# Patient Record
Sex: Male | Born: 1980 | Race: Black or African American | Hispanic: No | Marital: Single | Smoking: Current every day smoker
Health system: Southern US, Community
[De-identification: ages and names within clinical notes are randomized; demographics above are authoritative.]

---

## 1999-03-27 ENCOUNTER — Emergency Department (HOSPITAL_COMMUNITY): Admission: EM | Admit: 1999-03-27 | Discharge: 1999-03-27 | Payer: Self-pay | Admitting: Emergency Medicine

## 1999-11-01 ENCOUNTER — Emergency Department (HOSPITAL_COMMUNITY): Admission: EM | Admit: 1999-11-01 | Discharge: 1999-11-01 | Payer: Self-pay | Admitting: Emergency Medicine

## 2000-03-17 ENCOUNTER — Emergency Department (HOSPITAL_COMMUNITY): Admission: EM | Admit: 2000-03-17 | Discharge: 2000-03-17 | Payer: Self-pay | Admitting: Emergency Medicine

## 2001-07-20 ENCOUNTER — Emergency Department (HOSPITAL_COMMUNITY): Admission: EM | Admit: 2001-07-20 | Discharge: 2001-07-20 | Payer: Self-pay | Admitting: Emergency Medicine

## 2001-11-18 ENCOUNTER — Emergency Department (HOSPITAL_COMMUNITY): Admission: EM | Admit: 2001-11-18 | Discharge: 2001-11-18 | Payer: Self-pay | Admitting: *Deleted

## 2001-11-18 ENCOUNTER — Encounter: Payer: Self-pay | Admitting: Emergency Medicine

## 2003-06-27 ENCOUNTER — Encounter: Payer: Self-pay | Admitting: Emergency Medicine

## 2003-06-27 ENCOUNTER — Emergency Department (HOSPITAL_COMMUNITY): Admission: EM | Admit: 2003-06-27 | Discharge: 2003-06-27 | Payer: Self-pay | Admitting: Emergency Medicine

## 2003-07-11 ENCOUNTER — Emergency Department (HOSPITAL_COMMUNITY): Admission: EM | Admit: 2003-07-11 | Discharge: 2003-07-11 | Payer: Self-pay | Admitting: Emergency Medicine

## 2004-07-24 ENCOUNTER — Emergency Department (HOSPITAL_COMMUNITY): Admission: EM | Admit: 2004-07-24 | Discharge: 2004-07-24 | Payer: Self-pay | Admitting: Emergency Medicine

## 2004-08-13 ENCOUNTER — Emergency Department (HOSPITAL_COMMUNITY): Admission: EM | Admit: 2004-08-13 | Discharge: 2004-08-13 | Payer: Self-pay

## 2004-09-20 ENCOUNTER — Emergency Department (HOSPITAL_COMMUNITY): Admission: EM | Admit: 2004-09-20 | Discharge: 2004-09-20 | Payer: Self-pay | Admitting: Emergency Medicine

## 2005-10-06 ENCOUNTER — Emergency Department (HOSPITAL_COMMUNITY): Admission: EM | Admit: 2005-10-06 | Discharge: 2005-10-06 | Payer: Self-pay | Admitting: Emergency Medicine

## 2005-10-17 ENCOUNTER — Emergency Department (HOSPITAL_COMMUNITY): Admission: EM | Admit: 2005-10-17 | Discharge: 2005-10-17 | Payer: Self-pay | Admitting: Emergency Medicine

## 2011-12-27 ENCOUNTER — Emergency Department (HOSPITAL_COMMUNITY)
Admission: EM | Admit: 2011-12-27 | Discharge: 2011-12-27 | Disposition: A | Discharge status: Home / Self Care | Payer: Self-pay | Attending: Emergency Medicine | Admitting: Emergency Medicine

## 2011-12-27 ENCOUNTER — Encounter: Payer: Self-pay | Admitting: Emergency Medicine

## 2011-12-27 ENCOUNTER — Emergency Department (HOSPITAL_COMMUNITY): Payer: Self-pay

## 2011-12-27 DIAGNOSIS — S0005XA Superficial foreign body of scalp, initial encounter: Secondary | ICD-10-CM | POA: Insufficient documentation

## 2011-12-27 DIAGNOSIS — S00551A Superficial foreign body of lip, initial encounter: Secondary | ICD-10-CM

## 2011-12-27 DIAGNOSIS — S025XXA Fracture of tooth (traumatic), initial encounter for closed fracture: Secondary | ICD-10-CM | POA: Insufficient documentation

## 2011-12-27 DIAGNOSIS — S0085XA Superficial foreign body of other part of head, initial encounter: Secondary | ICD-10-CM | POA: Insufficient documentation

## 2011-12-27 DIAGNOSIS — IMO0002 Reserved for concepts with insufficient information to code with codable children: Secondary | ICD-10-CM | POA: Insufficient documentation

## 2011-12-27 MED ORDER — CEPHALEXIN 500 MG PO CAPS
500.0000 mg | ORAL_CAPSULE | Freq: Once | ORAL | Status: AC
  Administered 2011-12-27: 500 mg via ORAL
  Filled 2011-12-27: qty 1

## 2011-12-27 MED ORDER — LIDOCAINE HCL (PF) 1 % IJ SOLN
5.0000 mL | Freq: Once | INTRAMUSCULAR | Status: AC
  Administered 2011-12-27: 5 mL
  Filled 2011-12-27: qty 5

## 2011-12-27 MED ORDER — CEPHALEXIN 500 MG PO CAPS: 500.0000 mg | ORAL_CAPSULE | Freq: Four times a day (QID) | ORAL | Status: AC

## 2011-12-27 MED ORDER — BUPIVACAINE HCL (PF) 0.5 % IJ SOLN
20.0000 mL | Freq: Once | INTRAMUSCULAR | Status: AC
  Administered 2011-12-27: 5 mL
  Filled 2011-12-27: qty 30

## 2011-12-27 MED ORDER — LIDOCAINE HCL 1 % IJ SOLN
INTRAMUSCULAR | Status: AC
  Filled 2011-12-27: qty 20

## 2011-12-27 NOTE — ED Provider Notes (Signed)
Medical screening examination/treatment/procedure(s) were performed by non-physician practitioner and as supervising physician I was immediately available for consultation/collaboration.  Mack Thurmon T Tadarius Maland, MD 12/27/11 2341 

## 2011-12-27 NOTE — ED Notes (Signed)
Pt was working with a hammer that slipped, hitting him in the mouth. Through and through laceration to lower lip. Also broke right central incisor.

## 2011-12-27 NOTE — ED Provider Notes (Signed)
History     CSN: [id number]  Arrival date & time 12/27/11  1620   First MD Initiated Contact with Patient 12/27/11 1706      Chief Complaint  Patient presents with  . Lip Laceration    (Consider location/radiation/quality/duration/timing/severity/associated sxs/prior treatment) HPI Comments: Patient was pain.  She brought in a piece fell and hit him in the mouth.  Now has a small laceration to his lower lip broken upper tooth.  Patient feels that there may be a piece of his tooth embedded in his lip  The history is provided by the patient.    History reviewed. No pertinent past medical history.  No past surgical history on file.  No family history on file.  History  Substance Use Topics  . Smoking status: Current Everyday Smoker  . Smokeless tobacco: Not on file  . Alcohol Use:       Review of Systems  Constitutional: Negative.   HENT: Positive for facial swelling.   Eyes: Negative.   Gastrointestinal: Negative for nausea.  Musculoskeletal: Negative.   Neurological: Negative for dizziness and tremors.  Hematological: Negative.   Psychiatric/Behavioral: Negative.     Allergies  Acetaminophen  Home Medications   Current Outpatient Rx  Name Route Sig Dispense Refill  . CEPHALEXIN 500 MG PO CAPS Oral Take 1 capsule (500 mg total) by mouth 4 (four) times daily. 40 capsule 0    BP 129/57  Pulse 70  Temp 98.6 F (37 C)  Resp 16  SpO2 99%  Physical Exam  Constitutional: He appears well-developed and well-nourished.  HENT:  Mouth/Throat: Uvula is midline. Abnormal dentition.    Neck: Normal range of motion.  Cardiovascular: Normal rate.   Pulmonary/Chest: Effort normal.  Musculoskeletal: Normal range of motion.  Skin: Skin is warm.  Psychiatric: He has a normal mood and affect.    ED Course  FOREIGN BODY REMOVAL Date/Time: 12/27/2011 7:22 PM Performed by: Arman Filter Authorized by: Arman Filter Consent: Verbal consent obtained. Risks and  benefits: risks, benefits and alternatives were discussed Consent given by: patient Time out: Immediately prior to procedure a "time out" was called to verify the correct patient, procedure, equipment, support staff and site/side marked as required. Body area: mucosa General location: head/neck Anesthesia: nerve block Patient sedated: no Patient restrained: no Localization method: probed Tendon involvement: none Depth: deep Complexity: simple 1 objects recovered. Objects recovered: tooth fragment Post-procedure assessment: foreign body removed Patient tolerance: Patient tolerated the procedure well with no immediate complications.   (including critical care time)  Labs Reviewed - No data to display Dg Orthopantogram  12/27/2011  *RADIOLOGY REPORT*  Clinical Data: Struck in the mouth with a hammer.  Maxillary incisor chipped.  DG [facility] 12/27/2011:  Comparison: None.  Findings: Fracture involving the central incisor on the right side of the mandible (tooth #8).  No other fractured teeth identified. Incomplete eruption of the cuspid in the right side of the mandible (tooth #6).  Remaining teeth appear intact.  Tooth fragment overlying the mandibular incisors likely corresponds to the tooth fragment in the lower lip.  IMPRESSION: Fractured tooth #8 with the tooth fragment overlying the mandibular incisors, corresponding to the lower lip as noted clinically.  No other visible fractured teeth.  Incomplete eruption of tooth #6.  Original Report Authenticated By: Arnell Sieving, M.D.     1. Superficial foreign body of lip without major open wound and without infection       MDM  Tooth fracture, possible  foreign body embedded in lower lip.  Will obtain x-ray        Arman Filter, NP 12/27/11 1755  Arman Filter, NP 12/27/11 1923  Arman Filter, NP 12/27/11 1923  Arman Filter, NP 12/27/11 Kristopher Oppenheim

## 2014-04-28 ENCOUNTER — Emergency Department (HOSPITAL_COMMUNITY)
Admission: EM | Admit: 2014-04-28 | Discharge: 2014-04-28 | Disposition: A | Discharge status: Home / Self Care | Payer: Self-pay | Attending: Emergency Medicine | Admitting: Emergency Medicine

## 2014-04-28 ENCOUNTER — Emergency Department (HOSPITAL_COMMUNITY): Payer: Self-pay

## 2014-04-28 ENCOUNTER — Encounter (HOSPITAL_COMMUNITY): Payer: Self-pay | Admitting: Emergency Medicine

## 2014-04-28 DIAGNOSIS — Y9389 Activity, other specified: Secondary | ICD-10-CM | POA: Insufficient documentation

## 2014-04-28 DIAGNOSIS — S61213A Laceration without foreign body of left middle finger without damage to nail, initial encounter: Secondary | ICD-10-CM

## 2014-04-28 DIAGNOSIS — Y929 Unspecified place or not applicable: Secondary | ICD-10-CM | POA: Insufficient documentation

## 2014-04-28 DIAGNOSIS — S61209A Unspecified open wound of unspecified finger without damage to nail, initial encounter: Secondary | ICD-10-CM | POA: Insufficient documentation

## 2014-04-28 DIAGNOSIS — W278XXA Contact with other nonpowered hand tool, initial encounter: Secondary | ICD-10-CM | POA: Insufficient documentation

## 2014-04-28 DIAGNOSIS — F17200 Nicotine dependence, unspecified, uncomplicated: Secondary | ICD-10-CM | POA: Insufficient documentation

## 2014-04-28 MED ORDER — TRAMADOL HCL 50 MG PO TABS
50.0000 mg | ORAL_TABLET | Freq: Once | ORAL | Status: AC
  Administered 2014-04-28: 50 mg via ORAL
  Filled 2014-04-28: qty 1

## 2014-04-28 MED ORDER — SULFAMETHOXAZOLE-TRIMETHOPRIM 800-160 MG PO TABS: 1.0000 | ORAL_TABLET | Freq: Two times a day (BID) | ORAL | Status: DC

## 2014-04-28 MED ORDER — CEPHALEXIN 500 MG PO CAPS: 500.0000 mg | ORAL_CAPSULE | Freq: Four times a day (QID) | ORAL | Status: DC

## 2014-04-28 MED ORDER — TRAMADOL HCL 50 MG PO TABS: 50.0000 mg | ORAL_TABLET | Freq: Four times a day (QID) | ORAL | Status: DC | PRN

## 2014-04-28 NOTE — ED Provider Notes (Signed)
Medical screening examination/treatment/procedure(s) were performed by non-physician practitioner and as supervising physician I was immediately available for consultation/collaboration.  Jalien Weakland T Destiny Hagin, MD 04/28/14 2322 

## 2014-04-28 NOTE — Discharge Instructions (Signed)
Keep wound clean. Return to the ED in 10 days for suture removal. Take Keflex and Bactrim as directed until gone. These are your antibiotics. Take Tramadol as needed for pain. Return to the ED with worsening or concerning symptoms.

## 2014-04-28 NOTE — ED Provider Notes (Signed)
CSN: [id number]     Arrival date & time 04/28/14  1936 History  This chart was scribed for non-physician practitioner Travis BeckKaitlyn Dariana Garbett, PA-C working with Travis BakerAnthony T Allen, MD by Travis Graves, ED Scribe. This patient was seen in room WTR6/WTR6 and the patient's care was started at 8:43 PM.    Chief Complaint  Patient presents with  . Finger Injury   Patient is a 33 y.o. male presenting with skin laceration. The history is provided by the patient. No language interpreter was used.  Laceration Location:  Hand Hand laceration location:  L finger Bleeding: controlled   Laceration mechanism:  Metal edge Pain details:    Severity:  Moderate   Timing:  Constant   Progression:  Unchanged Foreign body present:  No foreign bodies Relieved by:  Pressure Worsened by:  Movement Tetanus status:  Up to date  HPI Comments: Travis Graves is a 33 y.o. male who presents to the Emergency Department complaining of a laceration to the left, middle finger that he sustained PTA on a chainsaw. A gauze bandage was applied here and the bleeding is well-controlled with applied pressure at this time. He reports an associated, constant, sudden onset pain around the area that he states is exacerbated with movement of the finger. He reports that his last tetanus vaccination was 3 years ago and is UTD. Patient has an allergy to acetaminophen. Patient has no other pertinent medical history.   History reviewed. No pertinent past medical history. History reviewed. No pertinent past surgical history. History reviewed. No pertinent family history. History  Substance Use Topics  . Smoking status: Current Every Day Smoker -- 0.50 packs/day    Types: Cigarettes  . Smokeless tobacco: Not on file  . Alcohol Use: Yes    Review of Systems  Musculoskeletal: Positive for myalgias.  Skin: Positive for wound (laceration).  All other systems reviewed and are negative.  Allergies  Acetaminophen  Home Medications   Prior to  Admission medications   Not on File   Triage Vitals: BP 150/82  Pulse 84  Temp(Src) 98.3 F (36.8 C) (Oral)  Resp 18  SpO2 99%  Physical Exam  Nursing note and vitals reviewed. Constitutional: He is oriented to person, place, and time. He appears well-developed and well-nourished. No distress.  HENT:  Head: Normocephalic and atraumatic.  Eyes: Conjunctivae are normal.  Neck: Normal range of motion. Neck supple.  Pulmonary/Chest: Effort normal. No respiratory distress.  Abdominal: He exhibits no distension.  Musculoskeletal: Normal range of motion.  Neurological: He is alert and oriented to person, place, and time.  Skin: Skin is warm and dry.  3 cm laceration to the left middle finger over the PIP.   Psychiatric: He has a normal mood and affect. His behavior is normal.    ED Course  Procedures (including critical care time)  DIAGNOSTIC STUDIES: Oxygen Saturation is 99% on room air, normal by my interpretation.    COORDINATION OF CARE: 7:50 PM- Ordered an x-ray of the left middle finger.  8:45 PM- Discussed that x-ray results were negative. Discussed that the laceration will be repaired with sutures. Discussed treatment plan with patient at bedside and patient verbalized agreement.   9:05 PM- Successfully repaired laceration. Advised patient to follow up in 7-10 days to have the sutures removed. Will discharge patient with antibiotics. Return precautions given. Discussed treatment plan with patient at bedside and patient verbalized agreement.   LACERATION REPAIR PROCEDURE NOTE The patient's identification was confirmed and consent was obtained. This  procedure was performed by Travis BeckKaitlyn Jakeel Starliper, PA-C  at 8:45 PM. Site: left middle finger Sterile procedures observed Anesthetic used (type and amt): 1% lidocaine without epinephrine, 3 mL Suture type/size: 5.0 Prolene Length: 3 cm # of Sutures: 8 Technique: simple interrupted Complexity: simple Antibx ointment  applied Tetanus UTD  Site anesthetized, irrigated with NS, explored without evidence of foreign body, wound well approximated, site covered with dry, sterile dressing.  Patient tolerated procedure well without complications. Instructions for care discussed verbally and patient provided with additional written instructions for homecare and f/u.   Labs Review Labs Reviewed - No data to display  Imaging Review Dg Finger Middle Left  04/28/2014   CLINICAL DATA:  FINGER INJURY  EXAM: LEFT MIDDLE FINGER 2+V  COMPARISON:  None.  FINDINGS: There is no evidence of fracture or dislocation. There is no evidence of arthropathy or other focal bone abnormality. Soft tissues are unremarkable.  IMPRESSION: Negative.   Electronically Signed   By: Travis HolmesHector  Graves M.D.   On: 04/28/2014 20:13     EKG Interpretation None      MDM   Final diagnoses:  Laceration of left middle finger w/o foreign body w/o damage to nail    9:56 PM Patient's xray unremarkable for acute changes. Patient's laceration repaired without complication. Patient will be discharged with pain medication and antibiotics. Patient is up to date with tetanus. Vitals stable and patient afebrile.   I personally performed the services described in this documentation, which was scribed in my presence. The recorded information has been reviewed and is accurate.     Travis BeckKaitlyn Natara Graves, [place]PA-C 04/28/14 2158

## 2014-04-28 NOTE — ED Notes (Signed)
Pt reports cutting trees with a chainsaw, states the chainsaw "kicked back" cutting his L middle finger.  Lac noted on his middle finger.

## 2014-11-21 IMAGING — CR DG FINGER MIDDLE 2+V*L*
3 series · 3 of 3 positions shown · non-contrast
Comparison: None.

CLINICAL DATA: FINGER INJURY

EXAM:
LEFT MIDDLE FINGER 2+V

[x finger pa left]
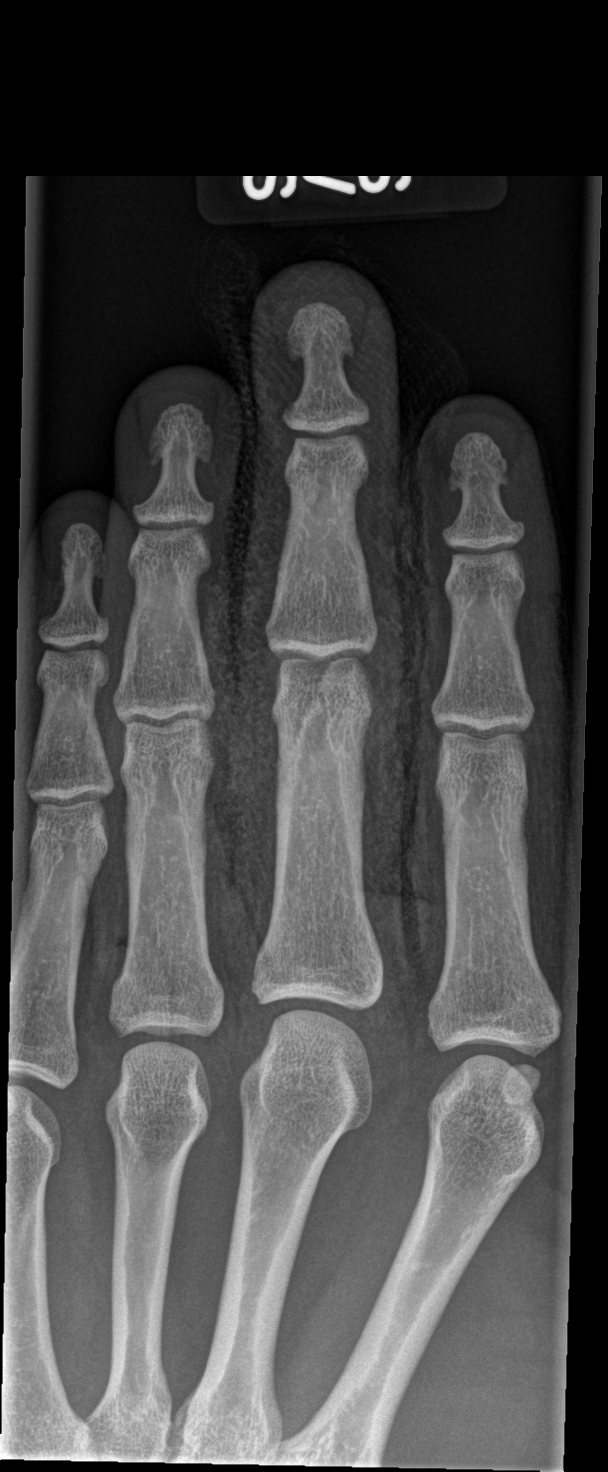

[x finger obl left]
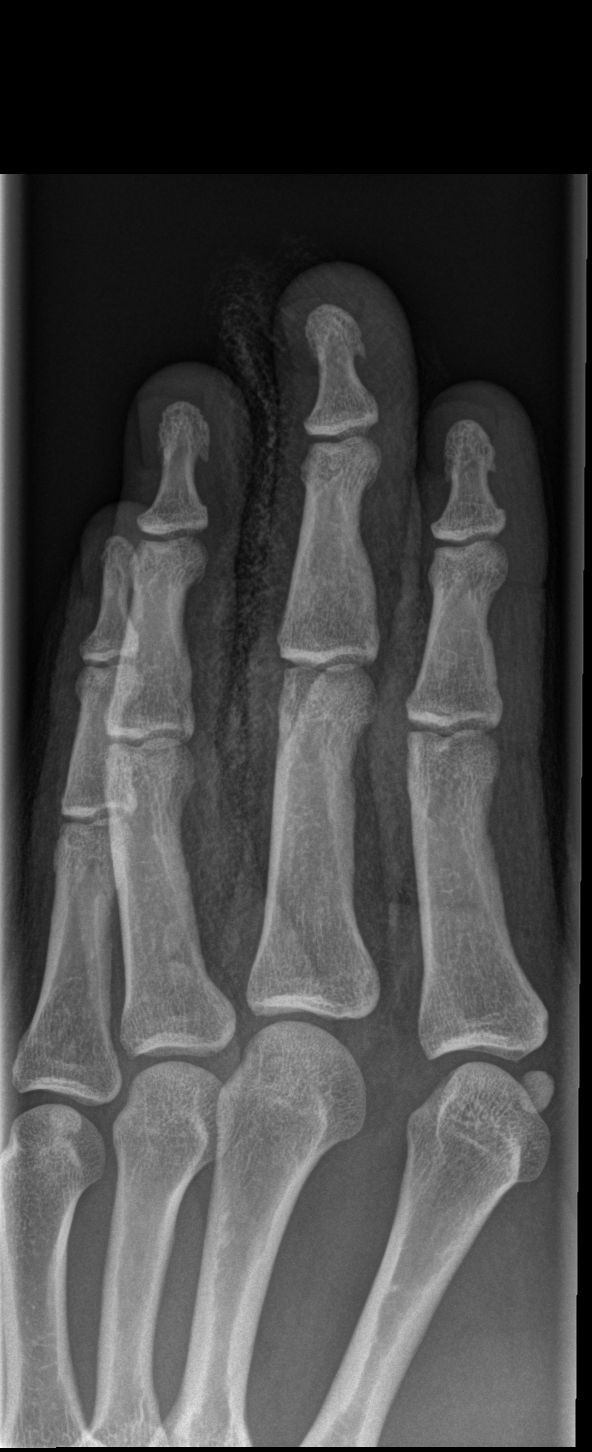

[x finger lat left]
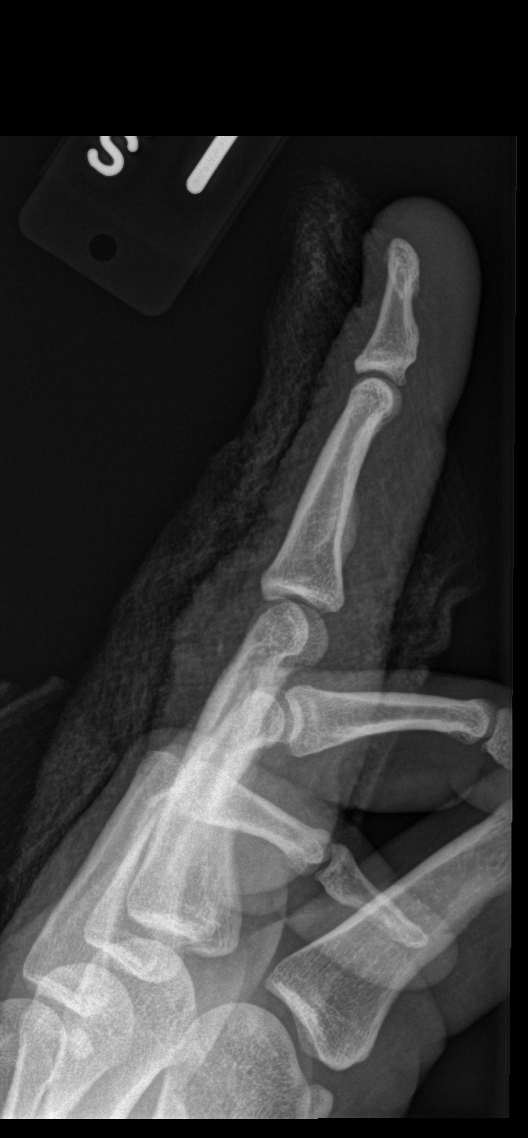

[3 of 3 positions shown; findings below may reference images not displayed]

FINDINGS: There is no evidence of fracture or dislocation. There is no
evidence of arthropathy or other focal bone abnormality. Soft
tissues are unremarkable.
IMPRESSION: Negative.

## 2015-10-02 ENCOUNTER — Emergency Department (HOSPITAL_COMMUNITY)
Admission: EM | Admit: 2015-10-02 | Discharge: 2015-10-02 | Disposition: A | Discharge status: Home / Self Care | Payer: Self-pay | Attending: Emergency Medicine | Admitting: Emergency Medicine

## 2015-10-02 ENCOUNTER — Encounter (HOSPITAL_COMMUNITY): Payer: Self-pay | Admitting: Emergency Medicine

## 2015-10-02 DIAGNOSIS — M5432 Sciatica, left side: Secondary | ICD-10-CM | POA: Insufficient documentation

## 2015-10-02 DIAGNOSIS — Z720 Tobacco use: Secondary | ICD-10-CM | POA: Insufficient documentation

## 2015-10-02 DIAGNOSIS — Z792 Long term (current) use of antibiotics: Secondary | ICD-10-CM | POA: Insufficient documentation

## 2015-10-02 MED ORDER — PREDNISONE 20 MG PO TABS: 40.0000 mg | ORAL_TABLET | Freq: Every day | ORAL | Status: DC

## 2015-10-02 NOTE — ED Provider Notes (Signed)
CSN: [id number]     Arrival date & time 10/02/15  1058 History   First MD Initiated Contact with Patient 10/02/15 1131     Chief Complaint  Patient presents with  . left leg pain      (Consider location/radiation/quality/duration/timing/severity/associated sxs/prior Treatment) HPI Comments: Patient presents to the emergency department with chief complaint of left sided sciatica. Patient states that he has had these symptoms before. He states that recently just started yesterday. He reports increased pain in his left buttock when he sits. He has been stretching. He denies any bowel or bladder incontinence. Denies any saddle anesthesia.  The history is provided by the patient. No language interpreter was used.    History reviewed. No pertinent past medical history. History reviewed. No pertinent past surgical history. No family history on file. Social History  Substance Use Topics  . Smoking status: Current Every Day Smoker -- 0.50 packs/day    Types: Cigarettes  . Smokeless tobacco: None  . Alcohol Use: Yes    Review of Systems  Constitutional: Negative for fever and chills.  Gastrointestinal:       No bowel incontinence  Genitourinary:       No urinary incontinence  Musculoskeletal: Positive for myalgias, back pain and arthralgias.  Neurological:       No saddle anesthesia      Allergies  Acetaminophen  Home Medications   Prior to Admission medications   Medication Sig Start Date End Date Taking? Authorizing Provider  cephALEXin (KEFLEX) 500 MG capsule Take 1 capsule (500 mg total) by mouth 4 (four) times daily. 04/28/14   Kaitlyn Szekalski, PA-C  predniSONE (DELTASONE) 20 MG tablet Take 2 tablets (40 mg total) by mouth daily. 10/02/15   Roxy Horseman, PA-C  sulfamethoxazole-trimethoprim (SEPTRA DS) 800-160 MG per tablet Take 1 tablet by mouth every 12 (twelve) hours. 04/28/14   Kaitlyn Szekalski, PA-C  traMADol (ULTRAM) 50 MG tablet Take 1 tablet (50 mg total) by  mouth every 6 (six) hours as needed. 04/28/14   Kaitlyn Szekalski, PA-C   BP 156/90 mmHg  Pulse 80  Temp(Src) 98.4 F (36.9 C) (Oral)  Resp 18  SpO2 100% Physical Exam  Constitutional: He is oriented to person, place, and time. He appears well-developed and well-nourished. No distress.  HENT:  Head: Normocephalic and atraumatic.  Eyes: Conjunctivae and EOM are normal. Right eye exhibits no discharge. Left eye exhibits no discharge. No scleral icterus.  Neck: Normal range of motion. Neck supple. No tracheal deviation present.  Cardiovascular: Normal rate, regular rhythm and normal heart sounds.  Exam reveals no gallop and no friction rub.   No murmur heard. Pulmonary/Chest: Effort normal and breath sounds normal. No respiratory distress. He has no wheezes.  Abdominal: Soft. He exhibits no distension. There is no tenderness.  Musculoskeletal: Normal range of motion.  Left lumbar paraspinal muscles tender to palpation, no bony tenderness, step-offs, or gross abnormality or deformity of spine, patient is able to ambulate, moves all extremities  Bilateral great toe extension intact Bilateral plantar/dorsiflexion intact  Neurological: He is alert and oriented to person, place, and time.  Sensation and strength intact bilaterally   Skin: Skin is warm. He is not diaphoretic.  Psychiatric: He has a normal mood and affect. His behavior is normal. Judgment and thought content normal.  Nursing note and vitals reviewed.   ED Course  Procedures (including critical care time) Labs Review Labs Reviewed - No data to display  Imaging Review No results found. I have personally  reviewed and evaluated these images and lab results as part of my medical decision-making.   EKG Interpretation None      MDM   Final diagnoses:  Sciatica of left side    Patient with back pain.  No neurological deficits and normal neuro exam.  Patient is ambulatory.  No loss of bowel or bladder control.  Doubt  cauda equina.  Denies fever,  doubt epidural abscess or other lesion. Recommend back exercises, stretching, RICE, and will treat with a short course of prednisone.  Encouraged the patient that there could be a need for additional workup and/or imaging such as MRI, if the symptoms do not resolve. Patient advised that if the back pain does not resolve, or radiates, this could progress to more serious conditions and is encouraged to follow-up with PCP or orthopedics within 2 weeks.       Roxy Horseman, PA-C 10/02/15 1144  Azalia Bilis, MD 10/02/15 [phone]

## 2015-10-02 NOTE — ED Notes (Signed)
Pt reports left leg pain radiating from hip to foot. Hx sciatica. Pt unable to sit due to pain. Pt took flexiril with some relief per pt.

## 2015-10-02 NOTE — Discharge Instructions (Signed)
Sciatica with Rehab The sciatic nerve runs from the back down the leg and is responsible for sensation and control of the muscles in the back (posterior) side of the thigh, lower leg, and foot. Sciatica is a condition that is characterized by inflammation of this nerve.  SYMPTOMS   Signs of nerve damage, including numbness and/or weakness along the posterior side of the lower extremity.  Pain in the back of the thigh that may also travel down the leg.  Pain that worsens when sitting for long periods of time.  Occasionally, pain in the back or buttock. CAUSES  Inflammation of the sciatic nerve is the cause of sciatica. The inflammation is due to something irritating the nerve. Common sources of irritation include:  Sitting for long periods of time.  Direct trauma to the nerve.  Arthritis of the spine.  Herniated or ruptured disk.  Slipping of the vertebrae (spondylolisthesis).  Pressure from soft tissues, such as muscles or ligament-like tissue (fascia). RISK INCREASES WITH:  Sports that place pressure or stress on the spine (football or weightlifting).  Poor strength and flexibility.  Failure to warm up properly before activity.  Family history of low back pain or disk disorders.  Previous back injury or surgery.  Poor body mechanics, especially when lifting, or poor posture. PREVENTION   Warm up and stretch properly before activity.  Maintain physical fitness:  Strength, flexibility, and endurance.  Cardiovascular fitness.  Learn and use proper technique, especially with posture and lifting. When possible, have coach correct improper technique.  Avoid activities that place stress on the spine. PROGNOSIS If treated properly, then sciatica usually resolves within 6 weeks. However, occasionally surgery is necessary.  RELATED COMPLICATIONS   Permanent nerve damage, including pain, numbness, tingle, or weakness.  Chronic back pain.  Risks of surgery: infection,  bleeding, nerve damage, or damage to surrounding tissues. TREATMENT Treatment initially involves resting from any activities that aggravate your symptoms. The use of ice and medication may help reduce pain and inflammation. The use of strengthening and stretching exercises may help reduce pain with activity. These exercises may be performed at home or with referral to a therapist. A therapist may recommend further treatments, such as transcutaneous electronic nerve stimulation (TENS) or ultrasound. Your caregiver may recommend corticosteroid injections to help reduce inflammation of the sciatic nerve. If symptoms persist despite non-surgical (conservative) treatment, then surgery may be recommended. MEDICATION  If pain medication is necessary, then nonsteroidal anti-inflammatory medications, such as aspirin and ibuprofen, or other minor pain relievers, such as acetaminophen, are often recommended.  Do not take pain medication for 7 days before surgery.  Prescription pain relievers may be given if deemed necessary by your caregiver. Use only as directed and only as much as you need.  Ointments applied to the skin may be helpful.  Corticosteroid injections may be given by your caregiver. These injections should be reserved for the most serious cases, because they may only be given a certain number of times. HEAT AND COLD  Cold treatment (icing) relieves pain and reduces inflammation. Cold treatment should be applied for 10 to 15 minutes every 2 to 3 hours for inflammation and pain and immediately after any activity that aggravates your symptoms. Use ice packs or massage the area with a piece of ice (ice massage).  Heat treatment may be used prior to performing the stretching and strengthening activities prescribed by your caregiver, physical therapist, or athletic trainer. Use a heat pack or soak the injury in warm water.   SEEK MEDICAL CARE IF:  Treatment seems to offer no benefit, or the condition  worsens.  Any medications produce adverse side effects. EXERCISES  RANGE OF MOTION (ROM) AND STRETCHING EXERCISES - Sciatica Most people with sciatic will find that their symptoms worsen with either excessive bending forward (flexion) or arching at the low back (extension). The exercises which will help resolve your symptoms will focus on the opposite motion. Your physician, physical therapist or athletic trainer will help you determine which exercises will be most helpful to resolve your low back pain. Do not complete any exercises without first consulting with your clinician. Discontinue any exercises which worsen your symptoms until you speak to your clinician. If you have pain, numbness or tingling which travels down into your buttocks, leg or foot, the goal of the therapy is for these symptoms to move closer to your back and eventually resolve. Occasionally, these leg symptoms will get better, but your low back pain may worsen; this is typically an indication of progress in your rehabilitation. Be certain to be very alert to any changes in your symptoms and the activities in which you participated in the 24 hours prior to the change. Sharing this information with your clinician will allow him/her to most efficiently treat your condition. These exercises may help you when beginning to rehabilitate your injury. Your symptoms may resolve with or without further involvement from your physician, physical therapist or athletic trainer. While completing these exercises, remember:   Restoring tissue flexibility helps normal motion to return to the joints. This allows healthier, less painful movement and activity.  An effective stretch should be held for at least 30 seconds.  A stretch should never be painful. You should only feel a gentle lengthening or release in the stretched tissue. FLEXION RANGE OF MOTION AND STRETCHING EXERCISES: STRETCH - Flexion, Single Knee to Chest   Lie on a firm bed or floor  with both legs extended in front of you.  Keeping one leg in contact with the floor, bring your opposite knee to your chest. Hold your leg in place by either grabbing behind your thigh or at your knee.  Pull until you feel a gentle stretch in your low back. Hold __________ seconds.  Slowly release your grasp and repeat the exercise with the opposite side. Repeat __________ times. Complete this exercise __________ times per day.  STRETCH - Flexion, Double Knee to Chest  Lie on a firm bed or floor with both legs extended in front of you.  Keeping one leg in contact with the floor, bring your opposite knee to your chest.  Tense your stomach muscles to support your back and then lift your other knee to your chest. Hold your legs in place by either grabbing behind your thighs or at your knees.  Pull both knees toward your chest until you feel a gentle stretch in your low back. Hold __________ seconds.  Tense your stomach muscles and slowly return one leg at a time to the floor. Repeat __________ times. Complete this exercise __________ times per day.  STRETCH - Low Trunk Rotation   Lie on a firm bed or floor. Keeping your legs in front of you, bend your knees so they are both pointed toward the ceiling and your feet are flat on the floor.  Extend your arms out to the side. This will stabilize your upper body by keeping your shoulders in contact with the floor.  Gently and slowly drop both knees together to one side until   you feel a gentle stretch in your low back. Hold for __________ seconds.  Tense your stomach muscles to support your low back as you bring your knees back to the starting position. Repeat the exercise to the other side. Repeat __________ times. Complete this exercise __________ times per day  EXTENSION RANGE OF MOTION AND FLEXIBILITY EXERCISES: STRETCH - Extension, Prone on Elbows  Lie on your stomach on the floor, a bed will be too soft. Place your palms about shoulder  width apart and at the height of your head.  Place your elbows under your shoulders. If this is too painful, stack pillows under your chest.  Allow your body to relax so that your hips drop lower and make contact more completely with the floor.  Hold this position for __________ seconds.  Slowly return to lying flat on the floor. Repeat __________ times. Complete this exercise __________ times per day.  RANGE OF MOTION - Extension, Prone Press Ups  Lie on your stomach on the floor, a bed will be too soft. Place your palms about shoulder width apart and at the height of your head.  Keeping your back as relaxed as possible, slowly straighten your elbows while keeping your hips on the floor. You may adjust the placement of your hands to maximize your comfort. As you gain motion, your hands will come more underneath your shoulders.  Hold this position __________ seconds.  Slowly return to lying flat on the floor. Repeat __________ times. Complete this exercise __________ times per day.  STRENGTHENING EXERCISES - Sciatica  These exercises may help you when beginning to rehabilitate your injury. These exercises should be done near your "sweet spot." This is the neutral, low-back arch, somewhere between fully rounded and fully arched, that is your least painful position. When performed in this safe range of motion, these exercises can be used for people who have either a flexion or extension based injury. These exercises may resolve your symptoms with or without further involvement from your physician, physical therapist or athletic trainer. While completing these exercises, remember:   Muscles can gain both the endurance and the strength needed for everyday activities through controlled exercises.  Complete these exercises as instructed by your physician, physical therapist or athletic trainer. Progress with the resistance and repetition exercises only as your caregiver advises.  You may  experience muscle soreness or fatigue, but the pain or discomfort you are trying to eliminate should never worsen during these exercises. If this pain does worsen, stop and make certain you are following the directions exactly. If the pain is still present after adjustments, discontinue the exercise until you can discuss the trouble with your clinician. STRENGTHENING - Deep Abdominals, Pelvic Tilt   Lie on a firm bed or floor. Keeping your legs in front of you, bend your knees so they are both pointed toward the ceiling and your feet are flat on the floor.  Tense your lower abdominal muscles to press your low back into the floor. This motion will rotate your pelvis so that your tail bone is scooping upwards rather than pointing at your feet or into the floor.  With a gentle tension and even breathing, hold this position for __________ seconds. Repeat __________ times. Complete this exercise __________ times per day.  STRENGTHENING - Abdominals, Crunches   Lie on a firm bed or floor. Keeping your legs in front of you, bend your knees so they are both pointed toward the ceiling and your feet are flat on the   floor. Cross your arms over your chest.  Slightly tip your chin down without bending your neck.  Tense your abdominals and slowly lift your trunk high enough to just clear your shoulder blades. Lifting higher can put excessive stress on the low back and does not further strengthen your abdominal muscles.  Control your return to the starting position. Repeat __________ times. Complete this exercise __________ times per day.  STRENGTHENING - Quadruped, Opposite UE/LE Lift  Assume a hands and knees position on a firm surface. Keep your hands under your shoulders and your knees under your hips. You may place padding under your knees for comfort.  Find your neutral spine and gently tense your abdominal muscles so that you can maintain this position. Your shoulders and hips should form a rectangle  that is parallel with the floor and is not twisted.  Keeping your trunk steady, lift your right hand no higher than your shoulder and then your left leg no higher than your hip. Make sure you are not holding your breath. Hold this position __________ seconds.  Continuing to keep your abdominal muscles tense and your back steady, slowly return to your starting position. Repeat with the opposite arm and leg. Repeat __________ times. Complete this exercise __________ times per day.  STRENGTHENING - Abdominals and Quadriceps, Straight Leg Raise   Lie on a firm bed or floor with both legs extended in front of you.  Keeping one leg in contact with the floor, bend the other knee so that your foot can rest flat on the floor.  Find your neutral spine, and tense your abdominal muscles to maintain your spinal position throughout the exercise.  Slowly lift your straight leg off the floor about 6 inches for a count of 15, making sure to not hold your breath.  Still keeping your neutral spine, slowly lower your leg all the way to the floor. Repeat this exercise with each leg __________ times. Complete this exercise __________ times per day. POSTURE AND BODY MECHANICS CONSIDERATIONS - Sciatica Keeping correct posture when sitting, standing or completing your activities will reduce the stress put on different body tissues, allowing injured tissues a chance to heal and limiting painful experiences. The following are general guidelines for improved posture. Your physician or physical therapist will provide you with any instructions specific to your needs. While reading these guidelines, remember:  The exercises prescribed by your provider will help you have the flexibility and strength to maintain correct postures.  The correct posture provides the optimal environment for your joints to work. All of your joints have less wear and tear when properly supported by a spine with good posture. This means you will  experience a healthier, less painful body.  Correct posture must be practiced with all of your activities, especially prolonged sitting and standing. Correct posture is as important when doing repetitive low-stress activities (typing) as it is when doing a single heavy-load activity (lifting). RESTING POSITIONS Consider which positions are most painful for you when choosing a resting position. If you have pain with flexion-based activities (sitting, bending, stooping, squatting), choose a position that allows you to rest in a less flexed posture. You would want to avoid curling into a fetal position on your side. If your pain worsens with extension-based activities (prolonged standing, working overhead), avoid resting in an extended position such as sleeping on your stomach. Most people will find more comfort when they rest with their spine in a more neutral position, neither too rounded nor too   arched. Lying on a non-sagging bed on your side with a pillow between your knees, or on your back with a pillow under your knees will often provide some relief. Keep in mind, being in any one position for a prolonged period of time, no matter how correct your posture, can still lead to stiffness. PROPER SITTING POSTURE In order to minimize stress and discomfort on your spine, you must sit with correct posture Sitting with good posture should be effortless for a healthy body. Returning to good posture is a gradual process. Many people can work toward this most comfortably by using various supports until they have the flexibility and strength to maintain this posture on their own. When sitting with proper posture, your ears will fall over your shoulders and your shoulders will fall over your hips. You should use the back of the chair to support your upper back. Your low back will be in a neutral position, just slightly arched. You may place a small pillow or folded towel at the base of your low back for support.  When  working at a desk, create an environment that supports good, upright posture. Without extra support, muscles fatigue and lead to excessive strain on joints and other tissues. Keep these recommendations in mind: CHAIR:   A chair should be able to slide under your desk when your back makes contact with the back of the chair. This allows you to work closely.  The chair's height should allow your eyes to be level with the upper part of your monitor and your hands to be slightly lower than your elbows. BODY POSITION  Your feet should make contact with the floor. If this is not possible, use a foot rest.  Keep your ears over your shoulders. This will reduce stress on your neck and low back. INCORRECT SITTING POSTURES   If you are feeling tired and unable to assume a healthy sitting posture, do not slouch or slump. This puts excessive strain on your back tissues, causing more damage and pain. Healthier options include:  Using more support, like a lumbar pillow.  Switching tasks to something that requires you to be upright or walking.  Talking a brief walk.  Lying down to rest in a neutral-spine position. PROLONGED STANDING WHILE SLIGHTLY LEANING FORWARD  When completing a task that requires you to lean forward while standing in one place for a long time, place either foot up on a stationary 2-4 inch high object to help maintain the best posture. When both feet are on the ground, the low back tends to lose its slight inward curve. If this curve flattens (or becomes too large), then the back and your other joints will experience too much stress, fatigue more quickly and can cause pain.  CORRECT STANDING POSTURES Proper standing posture should be assumed with all daily activities, even if they only take a few moments, like when brushing your teeth. As in sitting, your ears should fall over your shoulders and your shoulders should fall over your hips. You should keep a slight tension in your abdominal  muscles to brace your spine. Your tailbone should point down to the ground, not behind your body, resulting in an over-extended swayback posture.  INCORRECT STANDING POSTURES  Common incorrect standing postures include a forward head, locked knees and/or an excessive swayback. WALKING Walk with an upright posture. Your ears, shoulders and hips should all line-up. PROLONGED ACTIVITY IN A FLEXED POSITION When completing a task that requires you to bend forward   at your waist or lean over a low surface, try to find a way to stabilize 3 of 4 of your limbs. You can place a hand or elbow on your thigh or rest a knee on the surface you are reaching across. This will provide you more stability so that your muscles do not fatigue as quickly. By keeping your knees relaxed, or slightly bent, you will also reduce stress across your low back. CORRECT LIFTING TECHNIQUES DO :   Assume a wide stance. This will provide you more stability and the opportunity to get as close as possible to the object which you are lifting.  Tense your abdominals to brace your spine; then bend at the knees and hips. Keeping your back locked in a neutral-spine position, lift using your leg muscles. Lift with your legs, keeping your back straight.  Test the weight of unknown objects before attempting to lift them.  Try to keep your elbows locked down at your sides in order get the best strength from your shoulders when carrying an object.  Always ask for help when lifting heavy or awkward objects. INCORRECT LIFTING TECHNIQUES DO NOT:   Lock your knees when lifting, even if it is a small object.  Bend and twist. Pivot at your feet or move your feet when needing to change directions.  Assume that you cannot safely pick up a paperclip without proper posture. Document Released: 12/16/2005 Document Revised: 05/02/2014 Document Reviewed: 03/30/2009 [employer] Patient Information 2015 [employer]. This information is not intended to  replace advice given to you by your health care provider. Make sure you discuss any questions you have with your health care provider.  

## 2016-04-27 ENCOUNTER — Emergency Department (HOSPITAL_COMMUNITY)
Admission: EM | Admit: 2016-04-27 | Discharge: 2016-04-27 | Disposition: A | Discharge status: Home / Self Care | Payer: Self-pay | Attending: Emergency Medicine | Admitting: Emergency Medicine

## 2016-04-27 ENCOUNTER — Encounter (HOSPITAL_COMMUNITY): Payer: Self-pay | Admitting: Emergency Medicine

## 2016-04-27 ENCOUNTER — Emergency Department (HOSPITAL_COMMUNITY): Payer: Self-pay

## 2016-04-27 DIAGNOSIS — N5089 Other specified disorders of the male genital organs: Secondary | ICD-10-CM

## 2016-04-27 DIAGNOSIS — F17210 Nicotine dependence, cigarettes, uncomplicated: Secondary | ICD-10-CM | POA: Insufficient documentation

## 2016-04-27 DIAGNOSIS — N451 Epididymitis: Secondary | ICD-10-CM | POA: Insufficient documentation

## 2016-04-27 DIAGNOSIS — Z791 Long term (current) use of non-steroidal anti-inflammatories (NSAID): Secondary | ICD-10-CM | POA: Insufficient documentation

## 2016-04-27 LAB — URINE MICROSCOPIC-ADD ON
Bacteria, UA: NONE SEEN
RBC / HPF: NONE SEEN RBC/hpf (ref 0–5)

## 2016-04-27 LAB — URINALYSIS, ROUTINE W REFLEX MICROSCOPIC
Bilirubin Urine: NEGATIVE
GLUCOSE, UA: NEGATIVE mg/dL
Hgb urine dipstick: NEGATIVE
KETONES UR: NEGATIVE mg/dL
NITRITE: NEGATIVE
PROTEIN: NEGATIVE mg/dL
Specific Gravity, Urine: 1.006 (ref 1.005–1.030)
pH: 6 (ref 5.0–8.0)

## 2016-04-27 MED ORDER — NAPROXEN 500 MG PO TABS: 500.0000 mg | ORAL_TABLET | Freq: Two times a day (BID) | ORAL | Status: DC

## 2016-04-27 NOTE — ED Notes (Signed)
Pt reports for the past 3 days his left testicle has continued to swell.  Patient states it is "twice the size it normally is".  Pt reports pain and denies any nausea or vomiting.

## 2016-04-27 NOTE — ED Provider Notes (Signed)
CSN: [id number]     Arrival date & time 04/27/16  0015 History   First MD Initiated Contact with Patient 04/27/16 [phone]     Chief Complaint  Patient presents with  . Groin Swelling     (Consider location/radiation/quality/duration/timing/severity/associated sxs/prior Treatment) HPI Comments: Patient here for evaluation of left testicular pain and scrotal swelling that started 3 days ago. No penile discharge, trauma to the area, fever, vomiting.   The history is provided by the patient. No language interpreter was used.    History reviewed. No pertinent past medical history. History reviewed. No pertinent past surgical history. No family history on file. Social History  Substance Use Topics  . Smoking status: Current Every Day Smoker -- 0.50 packs/day    Types: Cigarettes  . Smokeless tobacco: None  . Alcohol Use: Yes     Comment: occasionally    Review of Systems  Constitutional: Negative for fever.  Gastrointestinal: Negative for nausea and vomiting.  Genitourinary: Positive for scrotal swelling and testicular pain. Negative for dysuria, discharge and penile pain.  Skin: Negative for rash.      Allergies  Acetaminophen  Home Medications   Prior to Admission medications   Medication Sig Start Date End Date Taking? Authorizing Provider  naproxen sodium (ANAPROX) 220 MG tablet Take 220 mg by mouth 2 (two) times daily with a meal.   Yes Historical Provider, MD   BP 125/71 mmHg  Pulse 69  Temp(Src) 97.9 F (36.6 C)  Resp 18  Ht  (1.778 m)  Wt 79.379 kg  BMI 25.11 kg/m2  SpO2 99% Physical Exam  Constitutional: He is oriented to person, place, and time. He appears well-developed and well-nourished. No distress.  Abdominal: There is no tenderness.  Genitourinary:  Left testicular tenderness without palpable mass. Circumcised penis without visualized discharge. No inguinal lymphadenopathy.   Neurological: He is alert and oriented to person, place, and time.     ED Course  Procedures (including critical care time) Labs Review Labs Reviewed  URINALYSIS, ROUTINE W REFLEX MICROSCOPIC (NOT AT [facility])   Results for orders placed or performed during the hospital encounter of 04/27/16  Urinalysis, Routine w reflex microscopic (not at [facility])  Result Value Ref Range   Color, Urine YELLOW YELLOW   APPearance CLOUDY (A) CLEAR   Specific Gravity, Urine 1.006 1.005 - 1.030   pH 6.0 5.0 - 8.0   Glucose, UA NEGATIVE NEGATIVE mg/dL   Hgb urine dipstick NEGATIVE NEGATIVE   Bilirubin Urine NEGATIVE NEGATIVE   Ketones, ur NEGATIVE NEGATIVE mg/dL   Protein, ur NEGATIVE NEGATIVE mg/dL   Nitrite NEGATIVE NEGATIVE   Leukocytes, UA TRACE (A) NEGATIVE  Urine microscopic-add on  Result Value Ref Range   Squamous Epithelial / LPF 0-5 (A) NONE SEEN   WBC, UA 0-5 0 - 5 WBC/hpf   RBC / HPF NONE SEEN 0 - 5 RBC/hpf   Bacteria, UA NONE SEEN NONE SEEN     Imaging Review US Scrotum  04/27/2016  CLINICAL DATA:  LEFT testicular pain and swelling for 2 days. EXAM: SCROTAL ULTRASOUND DOPPLER ULTRASOUND OF THE TESTICLES TECHNIQUE: Complete ultrasound examination of the testicles, epididymis, and other scrotal structures was performed. Color and spectral Doppler ultrasound were also utilized to evaluate blood flow to the testicles. COMPARISON:  None. FINDINGS: Right testicle Measurements: 4.8 x 2.4 x 3.4 cm. No mass or microlithiasis visualized. Left testicle Measurements: 4 x 3 x 3.6 cm. No mass or microlithiasis visualized. Increased color flow. Right epididymis:  Normal  in size and appearance. Left epididymis: Enlarged at 3.5 x 2.6 x 4.7 cm with increased color flow. Hydrocele:  Small LEFT hydrocele with echogenic debris. Varicocele:  Mild bilateral varicoceles. Pulsed Doppler interrogation of both testes demonstrates normal low resistance arterial and venous waveforms bilaterally. IMPRESSION: LEFT epididymitis orchitis.  Small LEFT complex hydrocele. Electronically Signed    By: Awilda Metroourtnay  Bloomer M.D.   On: 04/27/2016 02:17   [place] Art/ven Flow Abd Pelv Doppler  04/27/2016  CLINICAL DATA:  LEFT testicular pain and swelling for 2 days. EXAM: SCROTAL ULTRASOUND DOPPLER ULTRASOUND OF THE TESTICLES TECHNIQUE: Complete ultrasound examination of the testicles, epididymis, and other scrotal structures was performed. Color and spectral Doppler ultrasound were also utilized to evaluate blood flow to the testicles. COMPARISON:  None. FINDINGS: Right testicle Measurements: 4.8 x 2.4 x 3.4 cm. No mass or microlithiasis visualized. Left testicle Measurements: 4 x 3 x 3.6 cm. No mass or microlithiasis visualized. Increased color flow. Right epididymis:  Normal in size and appearance. Left epididymis: Enlarged at 3.5 x 2.6 x 4.7 cm with increased color flow. Hydrocele:  Small LEFT hydrocele with echogenic debris. Varicocele:  Mild bilateral varicoceles. Pulsed Doppler interrogation of both testes demonstrates normal low resistance arterial and venous waveforms bilaterally. IMPRESSION: LEFT epididymitis orchitis.  Small LEFT complex hydrocele. Electronically Signed   By: Awilda Metroourtnay  Bloomer M.D.   On: 04/27/2016 02:17   I have personally reviewed and evaluated these images and lab results as part of my medical decision-making.   EKG Interpretation None      MDM   Final diagnoses:  Swelling of testicle    1. Epididymitis, left testicle  No evidence of torsion on ultrasound evaluation of painful, swollen left testicle. Patient well appearing, in NAD. Discussed STD as possible inciting factor and patient opts to obtain treatment for GC/Chlamydia. Cultures pending.     Elpidio AnisShari Christyann Manolis, PA-C 04/27/16 [id number]  Zadie Rhineonald Wickline, MD 04/27/16 [phone]

## 2016-04-27 NOTE — Discharge Instructions (Signed)
Epididymitis °Epididymitis is swelling (inflammation) of the epididymis. The epididymis is a cord-like structure that is located along the top and back part of the testicle. It collects and stores sperm from the testicle. °This condition can also cause pain and swelling of the testicle and scrotum. Symptoms usually start suddenly (acute epididymitis). Sometimes epididymitis starts gradually and lasts for a while (chronic epididymitis). This type may be harder to treat. °CAUSES °In men 35 and younger, this condition is usually caused by a bacterial infection or sexually transmitted disease (STD), such as: °· Gonorrhea. °· Chlamydia.   °In men 35 and older who do not have anal sex, this condition is usually caused by bacteria from a blockage or abnormalities in the urinary system. These can result from: °· Having a tube placed into the bladder (urinary catheter). °· Having an enlarged or inflamed prostate gland. °· Having recent urinary tract surgery. °In men who have a condition that weakens the body's defense system (immune system), such as HIV, this condition can be caused by:  °· Other bacteria, including tuberculosis and syphilis. °· Viruses. °· Fungi. °Sometimes this condition occurs without infection. That may happen if urine flows backward into the epididymis after heavy lifting or straining. °RISK FACTORS °This condition is more likely to develop in men: °· Who have unprotected sex with more than one partner. °· Who have anal sex.   °· Who have recently had surgery.   °· Who have a urinary catheter. °· Who have urinary problems. °· Who have a suppressed immune system.   °SYMPTOMS  °This condition usually begins suddenly with chills, fever, and pain behind the scrotum and in the testicle. Other symptoms include:  °· Swelling of the scrotum, testicle, or both. °· Pain when ejaculating or urinating. °· Pain in the back or belly. °· Nausea. °· Itching and discharge from the penis. °· Frequent need to pass  urine. °· Redness and tenderness of the scrotum. °DIAGNOSIS °Your health care provider can diagnose this condition based on your symptoms and medical history. Your health care provider will also do a physical exam to ask about your symptoms and check your scrotum and testicle for swelling, pain, and redness. You may also have other tests, including:   °· Examination of discharge from the penis. °· Urine tests for infections, such as STDs.   °Your health care provider may test you for other STDs, including HIV.  °TREATMENT °Treatment for this condition depends on the cause. If your condition is caused by a bacterial infection, oral antibiotic medicine may be prescribed. If the bacterial infection has spread to your blood, you may need to receive IV antibiotics. Nonbacterial epididymitis is treated with home care that includes bed rest and elevation of the scrotum. °Surgery may be needed to treat: °· Bacterial epididymitis that causes pus to build up in the scrotum (abscess). °· Chronic epididymitis that has not responded to other treatments. °HOME CARE INSTRUCTIONS °Medicines  °· Take over-the-counter and prescription medicines only as told by your health care provider.   °· If you were prescribed an antibiotic medicine, take it as told by your health care provider. Do not stop taking the antibiotic even if your condition improves. °Sexual Activity  °· If your epididymitis was caused by an STD, avoid sexual activity until your treatment is complete. °· Inform your sexual partner or partners if you test positive for an STD. They may need to be treated. Do not engage in sexual activity with your partner or partners until their treatment is completed. °General Instructions  °· Return to your normal activities as told   by your health care provider. Ask your health care provider what activities are safe for you. °· Keep your scrotum elevated and supported while resting. Ask your health care provider if you should wear a  scrotal support, such as a jockstrap. Wear it as told by your health care provider. °· If directed, apply ice to the affected area:   °¨ Put ice in a plastic bag. °¨ Place a towel between your skin and the bag. °¨ Leave the ice on for 20 minutes, 2-3 times per day. °· Try taking a sitz bath to help with discomfort. This is a warm water bath that is taken while you are sitting down. The water should only come up to your hips and should cover your buttocks. Do this 3-4 times per day or as told by your health care provider. °· Keep all follow-up visits as told by your health care provider. This is important. °SEEK MEDICAL CARE IF:  °· You have a fever.   °· Your pain medicine is not helping.   °· Your pain is getting worse.   °· Your symptoms do not improve within three days. °  °This information is not intended to replace advice given to you by your health care provider. Make sure you discuss any questions you have with your health care provider. °  °Document Released: 12/13/2000 Document Revised: 09/06/2015 Document Reviewed: 05/03/2015 °Elsevier Interactive Patient Education ©2016 [employer] ° °

## 2016-04-29 LAB — GC/CHLAMYDIA PROBE AMP (~~LOC~~) NOT AT ARMC
CHLAMYDIA, DNA PROBE: NEGATIVE
Neisseria Gonorrhea: NEGATIVE

## 2016-10-19 ENCOUNTER — Emergency Department (HOSPITAL_COMMUNITY)
Admission: EM | Admit: 2016-10-19 | Discharge: 2016-10-19 | Disposition: A | Discharge status: Home / Self Care | Payer: Self-pay | Attending: Emergency Medicine | Admitting: Emergency Medicine

## 2016-10-19 ENCOUNTER — Encounter (HOSPITAL_COMMUNITY): Payer: Self-pay

## 2016-10-19 DIAGNOSIS — K0889 Other specified disorders of teeth and supporting structures: Secondary | ICD-10-CM | POA: Insufficient documentation

## 2016-10-19 DIAGNOSIS — F17210 Nicotine dependence, cigarettes, uncomplicated: Secondary | ICD-10-CM | POA: Insufficient documentation

## 2016-10-19 MED ORDER — NAPROXEN 500 MG PO TABS: 500.0000 mg | ORAL_TABLET | Freq: Two times a day (BID) | ORAL | 0 refills | Status: AC

## 2016-10-19 MED ORDER — HYDROCODONE-ACETAMINOPHEN 5-325 MG PO TABS
1.0000 | ORAL_TABLET | Freq: Once | ORAL | Status: AC
  Administered 2016-10-19: 1 via ORAL
  Filled 2016-10-19: qty 1

## 2016-10-19 MED ORDER — NAPROXEN 500 MG PO TABS
500.0000 mg | ORAL_TABLET | Freq: Once | ORAL | Status: AC
  Administered 2016-10-19: 500 mg via ORAL
  Filled 2016-10-19: qty 1

## 2016-10-19 MED ORDER — PENICILLIN V POTASSIUM 500 MG PO TABS: 500.0000 mg | ORAL_TABLET | Freq: Four times a day (QID) | ORAL | 0 refills | Status: AC

## 2016-10-19 NOTE — ED Provider Notes (Signed)
[facility]-EMERGENCY DEPT Provider Note   CSN: [id number] Arrival date & time: 10/19/16  0539     History   Chief Complaint Chief Complaint  Patient presents with  . Dental Pain    HPI Travis Graves is a 35 y.o. male.  HPI  This is a 35 year old male who presents with dental pain. Reports 10 out of 10 lower dental pain. Ongoing for the last 3 days. Worse with heat. Patient has tried BC powder and Aleve with minimal relief. Denies any fevers or difficulty swallowing. He does not have a dentist.  History reviewed. No pertinent past medical history.  There are no active problems to display for this patient.   History reviewed. No pertinent surgical history.     Home Medications    Prior to Admission medications   Medication Sig Start Date End Date Taking? Authorizing Provider  naproxen (NAPROSYN) 500 MG tablet Take 1 tablet (500 mg total) by mouth 2 (two) times daily. 10/19/16   Shon Batonourtney F Horton, MD  naproxen sodium (ANAPROX) 220 MG tablet Take 220 mg by mouth 2 (two) times daily with a meal.    Historical Provider, MD  penicillin v potassium (VEETID) 500 MG tablet Take 1 tablet (500 mg total) by mouth 4 (four) times daily. 10/19/16 10/26/16  Shon Batonourtney F Horton, MD    Family History No family history on file.  Social History Social History  Substance Use Topics  . Smoking status: Current Every Day Smoker    Packs/day: 0.50    Types: Cigarettes  . Smokeless tobacco: Never Used  . Alcohol use Yes     Comment: occasionally     Allergies   Acetaminophen   Review of Systems Review of Systems  Constitutional: Negative for fever.  HENT: Positive for dental problem. Negative for facial swelling and trouble swallowing.   All other systems reviewed and are negative.    Physical Exam Updated Vital Signs BP 147/88   Pulse 82   Temp 98.2 F (36.8 C) (Oral)   Resp 18   Ht 5\' 11"  (1.803 m)   Wt 180 lb (81.6 kg)   SpO2 99%   BMI 25.10 kg/m   Physical Exam    Constitutional: He is oriented to person, place, and time. He appears well-developed and well-nourished.  HENT:  Head: Normocephalic and atraumatic.  Generally poor dentition, decayed tooth 17 and 19, tenderness to palpation over the gumline of tooth 19, no obvious abscess, no trismus, no fullness noted under the tongue  Neck: Neck supple.  Cardiovascular: Normal rate and regular rhythm.   No murmur heard. Pulmonary/Chest: Effort normal. No stridor. No respiratory distress.  Lymphadenopathy:    He has no cervical adenopathy.  Neurological: He is alert and oriented to person, place, and time.  Skin: Skin is warm and dry.  Psychiatric: He has a normal mood and affect.  Nursing note and vitals reviewed.    ED Treatments / Results  Labs (all labs ordered are listed, but only abnormal results are displayed) Labs Reviewed - No data to display  EKG  EKG Interpretation None       Radiology No results found.  Procedures Dental Block Date/Time: 10/19/2016 6:26 AM Performed by: Shon BatonHORTON, COURTNEY F Authorized by: Shon BatonHORTON, COURTNEY F   Consent:    Consent obtained:  Verbal   Consent given by:  Patient Indications:    Indications: dental pain   Location:    Block type:  Inferior alveolar Procedure details (see MAR for exact dosages):  Syringe type:  Controlled syringe   Needle gauge:  27 G   Anesthetic injected:  Bupivacaine 0.25% WITH epi   Injection procedure:  Anatomic landmarks identified, introduced needle, incremental injection and anatomic landmarks palpated Post-procedure details:    Patient tolerance of procedure:  Tolerated well, no immediate complications   (including critical care time)  Medications Ordered in ED Medications  naproxen (NAPROSYN) tablet 500 mg (500 mg Oral Given 10/19/16 0612)  HYDROcodone-acetaminophen (NORCO/VICODIN) 5-325 MG per tablet 1 tablet (1 tablet Oral Given 10/19/16 0612)     Initial Impression / Assessment and Plan / ED Course   I have reviewed the triage vital signs and the nursing notes.  Pertinent labs & imaging results that were available during my care of the patient were reviewed by me and considered in my medical decision making (see chart for details).  Clinical Course    Patient presents with dental pain. Appears uncomplicated. Dental decay and likely early infection. No obvious abscess. No evidence of deep space infection. Dental block was performed. Will discharge with naproxen and penicillin.  After history, exam, and medical workup I feel the patient has been appropriately medically screened and is safe for discharge home. Pertinent diagnoses were discussed with the patient. Patient was given return precautions.   Final Clinical Impressions(s) / ED Diagnoses   Final diagnoses:  Pain, dental    New Prescriptions New Prescriptions   NAPROXEN (NAPROSYN) 500 MG TABLET    Take 1 tablet (500 mg total) by mouth 2 (two) times daily.   PENICILLIN V POTASSIUM (VEETID) 500 MG TABLET    Take 1 tablet (500 mg total) by mouth 4 (four) times daily.     Shon Baton, MD 10/19/16 [phone]

## 2016-10-19 NOTE — ED Triage Notes (Signed)
Patient c/o left lower tooth pain that began x2 days ago.  Patient states that pain goes up into his ear and has gotten worse today. Patient states has been having trouble eating from the pain.

## 2016-10-19 NOTE — ED Notes (Signed)
Pt c/o of Left side tooth pain 10/10 that radiates to left side of jaw and head and ear x 2 days. Pt stated he took BC powder and alleve yet pain was unrelieved. Area assessed and pt has left side of face mild swelling, and left lower tooth appears to be decayed and tooth is cracked/broken

## 2017-10-31 IMAGING — US US ART/VEN ABD/PELV/SCROTUM DOPPLER LTD
1 series · 14 of 25 positions shown · non-contrast
Comparison: None.

CLINICAL DATA: LEFT testicular pain and swelling for 2 days.

EXAM:
SCROTAL ULTRASOUND
DOPPLER ULTRASOUND OF THE TESTICLES
TECHNIQUE: Complete ultrasound examination of the testicles, epididymis, and
other scrotal structures was performed. Color and spectral Doppler
ultrasound were also utilized to evaluate blood flow to the
testicles.

[Series 1: us art/ven abd/pelv/scrotum doppler ltd · 0.07mm/px · 14 of 66 slices shown]
[im 1/66]
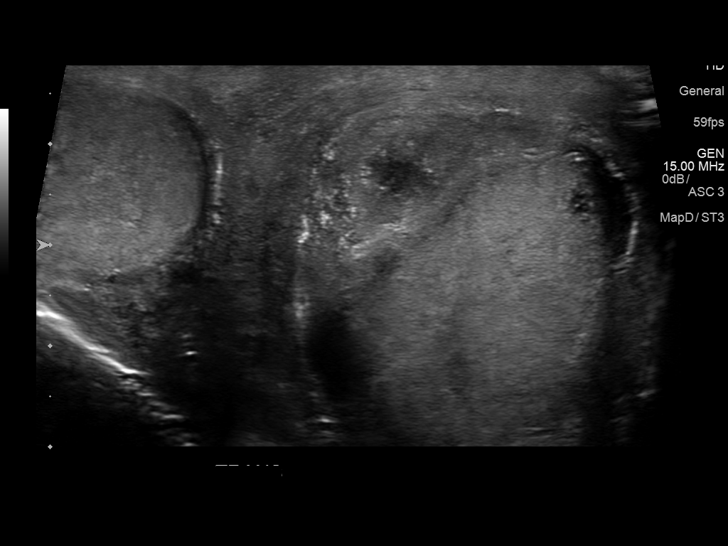
[im 6/66]
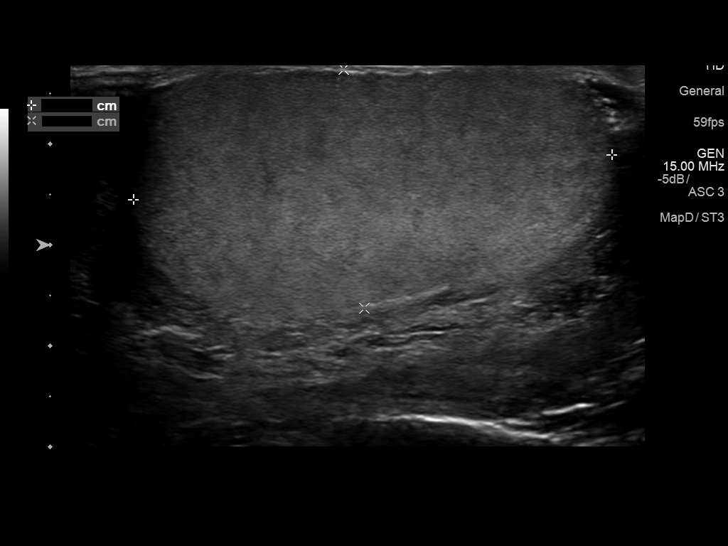
[im 11/66]
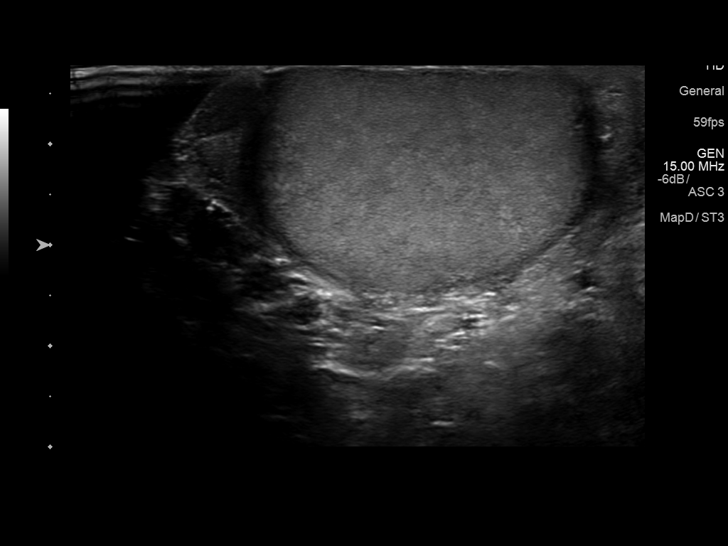
[im 17/66]
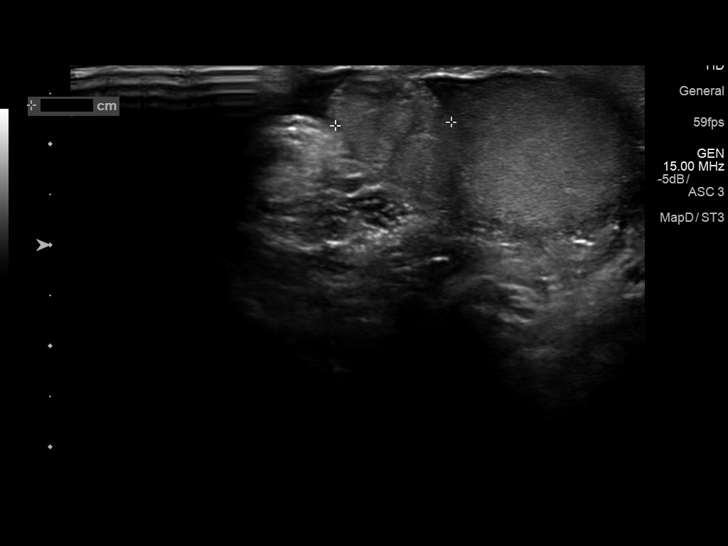
[im 22/66]
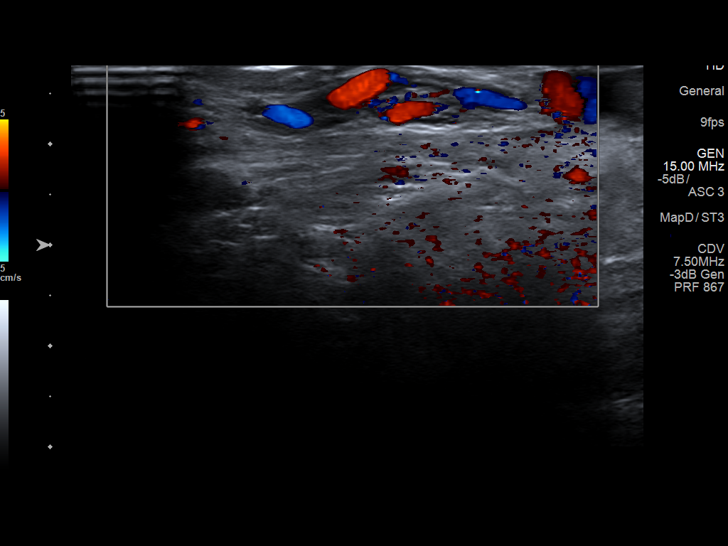
[im 25/66]
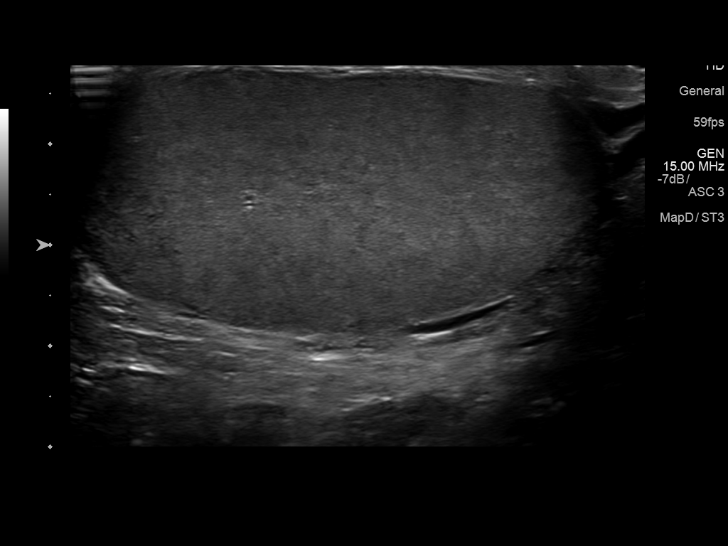
[im 30/66]
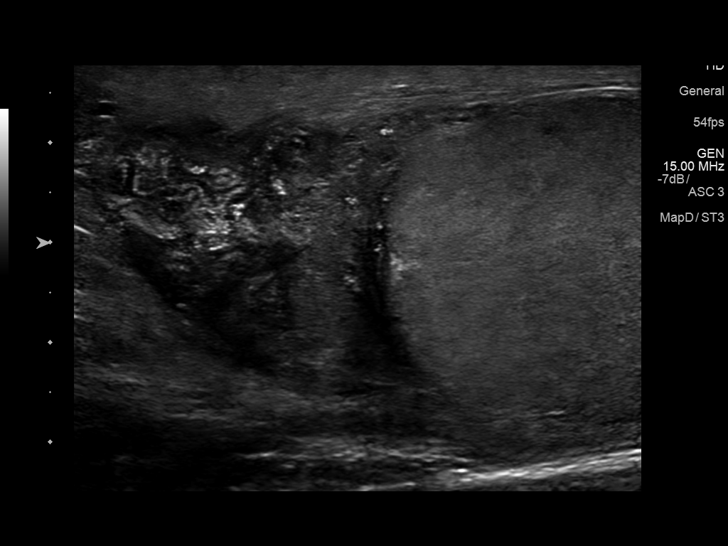
[im 36/66]
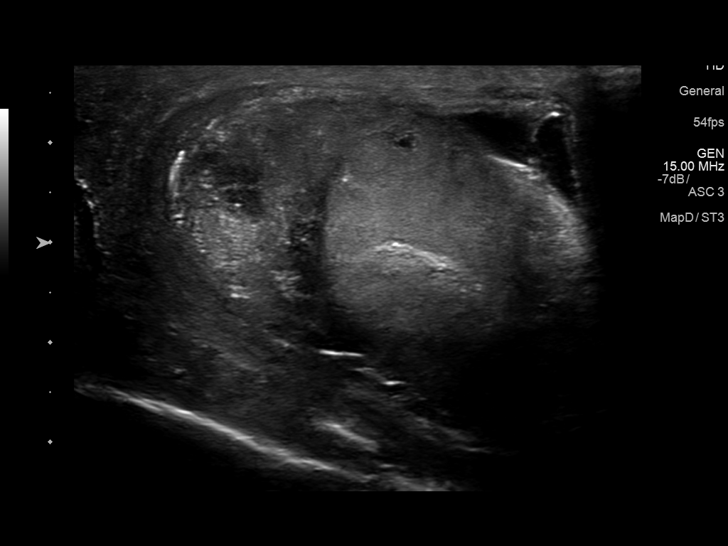
[im 41/66]
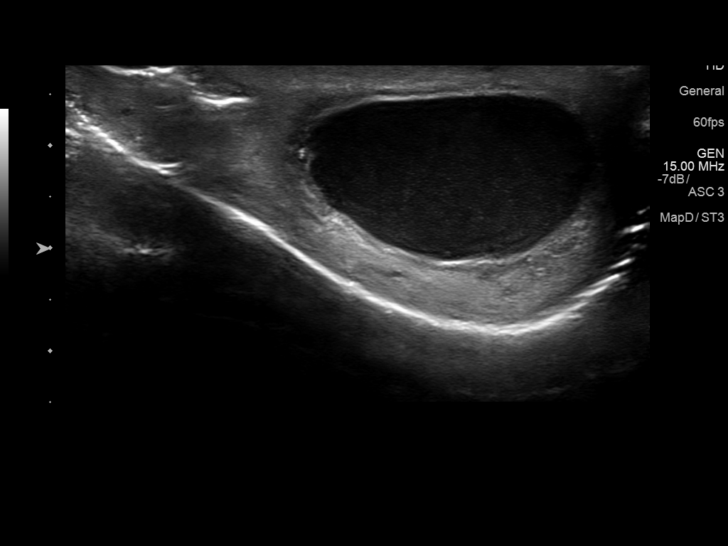
[im 44/66]
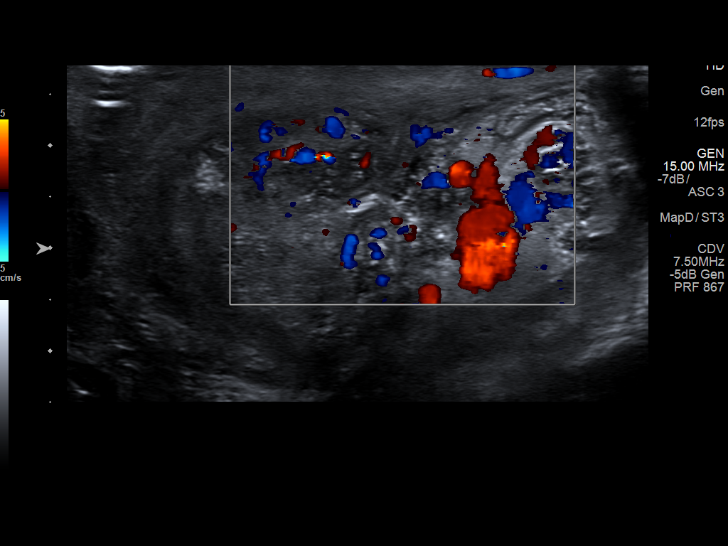
[im 49/66]
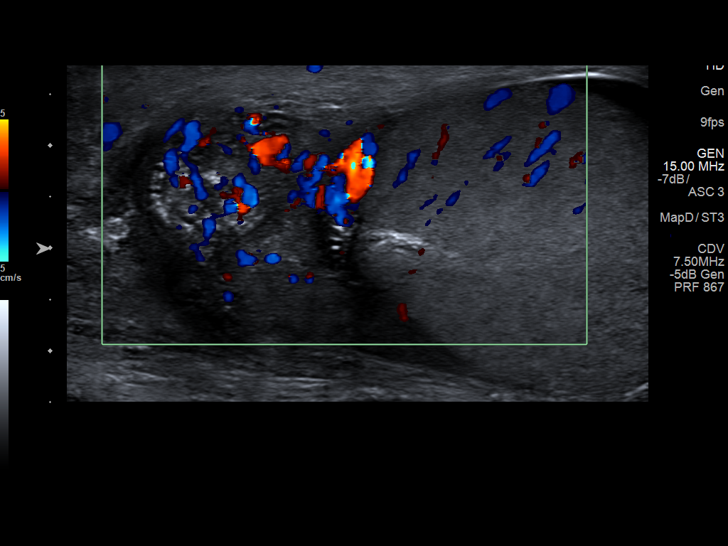
[im 55/66]
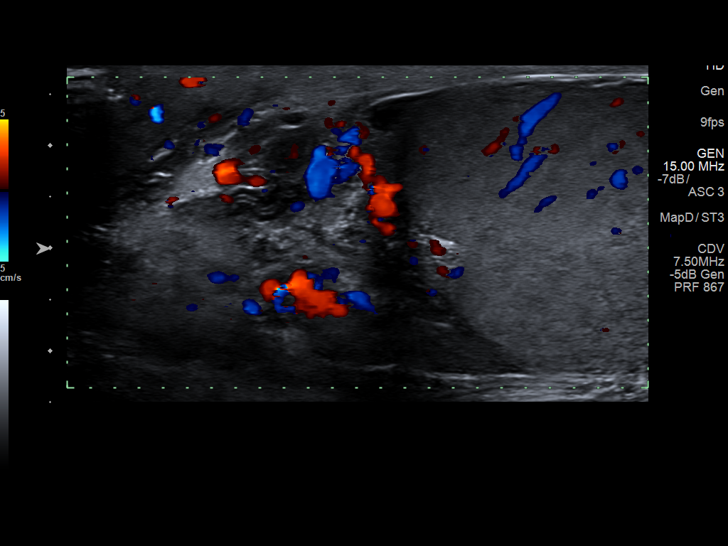
[im 60/66]
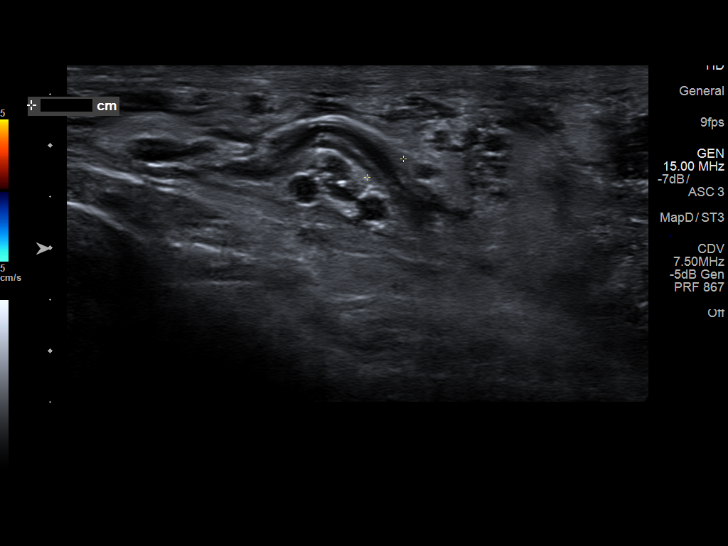
[im 66/66]
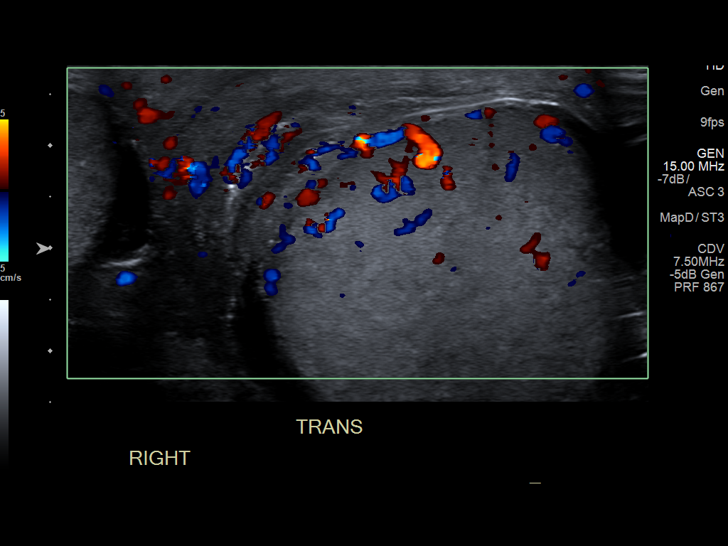

[14 of 25 positions shown; findings below may reference images not displayed]

FINDINGS: Right testicle

Measurements: 4.8 x 2.4 x 3.4 cm. No mass or microlithiasis
visualized.

Left testicle

Measurements: 4 x 3 x 3.6 cm. No mass or microlithiasis visualized.
Increased color flow.

Right epididymis:  Normal in size and appearance.

Left epididymis: Enlarged at 3.5 x 2.6 x 4.7 cm with increased color
flow.

Hydrocele:  Small LEFT hydrocele with echogenic debris.

Varicocele:  Mild bilateral varicoceles.

Pulsed Doppler interrogation of both testes demonstrates normal low
resistance arterial and venous waveforms bilaterally.
IMPRESSION: LEFT epididymitis orchitis.  Small LEFT complex hydrocele.

## 2018-11-10 ENCOUNTER — Emergency Department (HOSPITAL_COMMUNITY)
Admission: EM | Admit: 2018-11-10 | Discharge: 2018-11-10 | Disposition: A | Discharge status: Home / Self Care | Payer: Self-pay | Attending: Emergency Medicine | Admitting: Emergency Medicine

## 2018-11-10 ENCOUNTER — Emergency Department (HOSPITAL_COMMUNITY): Payer: Self-pay

## 2018-11-10 ENCOUNTER — Other Ambulatory Visit: Payer: Self-pay

## 2018-11-10 DIAGNOSIS — F17210 Nicotine dependence, cigarettes, uncomplicated: Secondary | ICD-10-CM | POA: Insufficient documentation

## 2018-11-10 DIAGNOSIS — I889 Nonspecific lymphadenitis, unspecified: Secondary | ICD-10-CM | POA: Insufficient documentation

## 2018-11-10 LAB — I-STAT CHEM 8, ED
BUN: 13 mg/dL (ref 6–20)
CHLORIDE: 105 mmol/L (ref 98–111)
Calcium, Ion: 1.17 mmol/L (ref 1.15–1.40)
Creatinine, Ser: 0.8 mg/dL (ref 0.61–1.24)
Glucose, Bld: 122 mg/dL — ABNORMAL HIGH (ref 70–99)
HCT: 46 % (ref 39.0–52.0)
Hemoglobin: 15.6 g/dL (ref 13.0–17.0)
POTASSIUM: 3.9 mmol/L (ref 3.5–5.1)
SODIUM: 140 mmol/L (ref 135–145)
TCO2: 26 mmol/L (ref 22–32)

## 2018-11-10 MED ORDER — CLINDAMYCIN PHOSPHATE 600 MG/50ML IV SOLN
600.0000 mg | Freq: Once | INTRAVENOUS | Status: AC
  Administered 2018-11-10: 600 mg via INTRAVENOUS
  Filled 2018-11-10: qty 50

## 2018-11-10 MED ORDER — CLINDAMYCIN HCL 150 MG PO CAPS: 450.0000 mg | ORAL_CAPSULE | Freq: Four times a day (QID) | ORAL | 0 refills | Status: AC

## 2018-11-10 MED ORDER — SODIUM CHLORIDE (PF) 0.9 % IJ SOLN
INTRAMUSCULAR | Status: AC
  Filled 2018-11-10: qty 50

## 2018-11-10 MED ORDER — IOHEXOL 300 MG/ML  SOLN
75.0000 mL | Freq: Once | INTRAMUSCULAR | Status: AC | PRN
Start: 2018-11-10 — End: 2018-11-10
  Administered 2018-11-10: 75 mL via INTRAVENOUS

## 2018-11-10 NOTE — ED Triage Notes (Signed)
Pt reports over the last week he has noticed swelling to the rt lymph node in neck. Pt reports that the pain is now extending up into the rt ear. Pt reports minimal cough.

## 2018-11-10 NOTE — ED Provider Notes (Signed)
[facility]-EMERGENCY DEPT Provider Note   CSN: [id number] Arrival date & time: 11/10/18  1334     History   Chief Complaint Chief Complaint  Patient presents with  . Adenopathy    HPI Travis Graves is a 37 y.o. male.  37 y/o male with no PMH presents to the ED with a chief complaint of adenopathy x 1 week. Patient reports his symptoms began 1 week ago with a slight sore throat and some swelling on the right, he reports the swelling has now gotten worse. He has tried over the counter medication for sore throat but reports no relieve in symptoms. He reports pain  With neck flexion and extension. He denies any fever, difficulty tolerating his secretions or changes in voice.      No past medical history on file.  There are no active problems to display for this patient.   No past surgical history on file.      Home Medications    Prior to Admission medications   Medication Sig Start Date End Date Taking? Authorizing Provider  ibuprofen (ADVIL,MOTRIN) 200 MG tablet Take 400 mg by mouth every 6 (six) hours as needed for mild pain.   Yes [provider]  Phenylephrine-DM-GG-APAP (MUCINEX FAST-MAX COLD FLU) 5-10-200-325 MG/10ML LIQD Take by mouth as directed.    Yes [provider]  PRESCRIPTION MEDICATION Take 1 tablet by mouth as directed.    Yes [provider]  clindamycin (CLEOCIN) 150 MG capsule Take 3 capsules (450 mg total) by mouth every 6 (six) hours for 7 days. 11/10/18 11/17/18  Claude Manges, PA-C  naproxen (NAPROSYN) 500 MG tablet Take 1 tablet (500 mg total) by mouth 2 (two) times daily. Patient not taking: Reported on 11/10/2018 10/19/16   Horton, Mayer Masker, MD    Family History No family history on file.  Social History Social History   Tobacco Use  . Smoking status: Current Every Day Smoker    Packs/day: 0.50    Types: Cigarettes  . Smokeless tobacco: Never Used  Substance Use Topics  . Alcohol use:  Yes    Comment: occasionally  . Drug use: Yes    Frequency: 7.0 times per week    Types: Marijuana    Comment: occasionally     Allergies   Acetaminophen   Review of Systems Review of Systems  Constitutional: Negative for chills and fever.  HENT: Positive for facial swelling, sore throat and trouble swallowing. Negative for voice change.   Respiratory: Negative for shortness of breath.   Cardiovascular: Negative for chest pain.     Physical Exam Updated Vital Signs BP 130/75 (BP Location: Right Arm)   Pulse 76   Temp 99 F (37.2 C) (Oral)   Resp 18   Ht 5\' 10"  (1.778 m)   Wt 88.5 kg   SpO2 100%   BMI 27.98 kg/m   Physical Exam  Constitutional: He is oriented to person, place, and time. He appears well-developed and well-nourished.  HENT:  Head: Normocephalic and atraumatic.  Mouth/Throat: Oropharynx is clear and moist.  Eyes: Pupils are equal, round, and reactive to light. No scleral icterus.  Neck: Trachea normal and normal range of motion. No neck rigidity. Edema present. No erythema present.    Cardiovascular: Normal heart sounds.  Pulmonary/Chest: Effort normal and breath sounds normal. He has no wheezes. He exhibits no tenderness.  Abdominal: Soft. Bowel sounds are normal. He exhibits no distension. There is no tenderness.  Musculoskeletal: He exhibits no  tenderness or deformity.  Neurological: He is alert and oriented to person, place, and time.  Skin: Skin is warm and dry.  Nursing note and vitals reviewed.    ED Treatments / Results  Labs (all labs ordered are listed, but only abnormal results are displayed) Labs Reviewed  I-STAT CHEM 8, ED - Abnormal; Notable for the following components:      Result Value   Glucose, Bld 122 (*)    All other components within normal limits    EKG None  Radiology Ct Soft Tissue Neck W Contrast  Result Date: 11/10/2018 CLINICAL DATA:  Sore throat, stridor. EXAM: CT NECK WITH CONTRAST TECHNIQUE:  Multidetector CT imaging of the neck was performed using the standard protocol following the bolus administration of intravenous contrast. CONTRAST:  75mL OMNIPAQUE IOHEXOL 300 MG/ML  SOLN COMPARISON:  None. FINDINGS: Pharynx and larynx: Normal. No mass or swelling. Salivary glands: No inflammation, mass, or stone. Thyroid: Negative Lymph nodes: Submental lymph node on the right 19 mm. Left submental lymph node 11 mm. Right level 2 lymph node 12 mm. Scattered small lymph nodes in the neck bilaterally. Vascular: Carotid artery and jugular vein patent bilaterally. Limited intracranial: Negative Visualized orbits: Negative Mastoids and visualized paranasal sinuses: Mild mucosal edema right maxillary sinus. Otherwise sinuses are clear Skeleton: Poor dentition. Multiple caries and periapical lucencies specially left lower and left upper molars. Upper chest: No acute skeletal lesion. Other: Soft tissue swelling in the submental region right greater than left. Large right submental lymph node. Thickening of the platysmas right greater than left. Infiltration in the subcutaneous fat under the chin bilaterally. No focal abscess. IMPRESSION: Soft tissue swelling in the submental region right greater than left. Enlarged right submental lymph node 19 mm. No abscess. Probable floor of the mouth infection without abscess. Airway intact. Poor dentition Electronically Signed   By: Marlan Palau M.D.   On: 11/10/2018 15:19    Procedures Procedures (including critical care time)  Medications Ordered in ED Medications  clindamycin (CLEOCIN) IVPB 600 mg (600 mg Intravenous New Bag/Given 11/10/18 1518)  sodium chloride (PF) 0.9 % injection (has no administration in time range)  iohexol (OMNIPAQUE) 300 MG/ML solution 75 mL (75 mLs Intravenous Contrast Given 11/10/18 1449)     Initial Impression / Assessment and Plan / ED Course  I have reviewed the triage vital signs and the nursing notes.  Pertinent labs & imaging  results that were available during my care of the patient were reviewed by me and considered in my medical decision making (see chart for details).    He presents with neck fluctuance, indurated mass below the mandibular region.  During examination there is tenderness to palpation below sublingual area on the outside, no tenderness to tonsillar lymph nodes.  He reports his pain has been going on for 1 week.  He denies any fever at this time, there is pain with neck extension making it worse to swallow, he tolerates his own secretions at this time.  Discussed this case with Dr. Fredderick Phenix Who has also seen this patient and agrees with management.  Will provide patient with some IV antibiotics while in the ED until CT soft tissue neck has resulted.  CT soft tissue showed: Soft tissue swelling in the submental region right greater than  left. Enlarged right submental lymph node 19 mm. No abscess.  Probable floor of the mouth infection without abscess. Airway  intact.     I have given patient 600 mg of IV clindamycin  while in the ED to help his swelling will also discharge him home with a prescription for clindamycin, I have discussed these results with patient who stands and agrees with management.  Return precautions provided.  Final Clinical Impressions(s) / ED Diagnoses   Final diagnoses:  Submental lymphadenitis    ED Discharge Orders         Ordered    clindamycin (CLEOCIN) 150 MG capsule  Every 6 hours     11/10/18 1529           Claude Manges, PA-C 11/10/18 1534    Rolan Bucco, MD 11/10/18 1557

## 2018-11-10 NOTE — Discharge Instructions (Signed)
I have prescribed antibiotics to treat your infection, please take 3 tablets three times a day for the next 7 days. If you experience any fever, difficulty swallowing or worsening symptoms please return to the ED for reevaluation.

## 2023-05-07 ENCOUNTER — Other Ambulatory Visit: Payer: Self-pay

## 2023-05-07 ENCOUNTER — Emergency Department (HOSPITAL_COMMUNITY)
Admission: EM | Admit: 2023-05-07 | Discharge: 2023-05-07 | Disposition: A | Discharge status: Home / Self Care | Attending: Emergency Medicine | Admitting: Emergency Medicine

## 2023-05-07 ENCOUNTER — Emergency Department (HOSPITAL_COMMUNITY)

## 2023-05-07 ENCOUNTER — Encounter (HOSPITAL_COMMUNITY): Payer: Self-pay

## 2023-05-07 DIAGNOSIS — R079 Chest pain, unspecified: Secondary | ICD-10-CM | POA: Diagnosis not present

## 2023-05-07 DIAGNOSIS — S8992XA Unspecified injury of left lower leg, initial encounter: Secondary | ICD-10-CM | POA: Diagnosis present

## 2023-05-07 DIAGNOSIS — Y92410 Unspecified street and highway as the place of occurrence of the external cause: Secondary | ICD-10-CM | POA: Diagnosis not present

## 2023-05-07 DIAGNOSIS — Z23 Encounter for immunization: Secondary | ICD-10-CM | POA: Diagnosis not present

## 2023-05-07 DIAGNOSIS — S82832A Other fracture of upper and lower end of left fibula, initial encounter for closed fracture: Secondary | ICD-10-CM | POA: Diagnosis not present

## 2023-05-07 DIAGNOSIS — D72829 Elevated white blood cell count, unspecified: Secondary | ICD-10-CM | POA: Diagnosis not present

## 2023-05-07 DIAGNOSIS — R930 Abnormal findings on diagnostic imaging of skull and head, not elsewhere classified: Secondary | ICD-10-CM | POA: Insufficient documentation

## 2023-05-07 DIAGNOSIS — T148XXA Other injury of unspecified body region, initial encounter: Secondary | ICD-10-CM

## 2023-05-07 DIAGNOSIS — S129XXA Fracture of neck, unspecified, initial encounter: Secondary | ICD-10-CM

## 2023-05-07 LAB — CBC WITH DIFFERENTIAL/PLATELET
Abs Immature Granulocytes: 0.09 10*3/uL — ABNORMAL HIGH (ref 0.00–0.07)
Basophils Absolute: 0 10*3/uL (ref 0.0–0.1)
Basophils Relative: 0 %
Eosinophils Absolute: 0 10*3/uL (ref 0.0–0.5)
Eosinophils Relative: 0 %
HCT: 44.4 % (ref 39.0–52.0)
Hemoglobin: 14.2 g/dL (ref 13.0–17.0)
Immature Granulocytes: 1 %
Lymphocytes Relative: 6 %
Lymphs Abs: 1 10*3/uL (ref 0.7–4.0)
MCH: 28.2 pg (ref 26.0–34.0)
MCHC: 32 g/dL (ref 30.0–36.0)
MCV: 88.1 fL (ref 80.0–100.0)
Monocytes Absolute: 0.7 10*3/uL (ref 0.1–1.0)
Monocytes Relative: 4 %
Neutro Abs: 13.8 10*3/uL — ABNORMAL HIGH (ref 1.7–7.7)
Neutrophils Relative %: 89 %
Platelets: 262 10*3/uL (ref 150–400)
RBC: 5.04 MIL/uL (ref 4.22–5.81)
RDW: 14 % (ref 11.5–15.5)
WBC: 15.6 10*3/uL — ABNORMAL HIGH (ref 4.0–10.5)
nRBC: 0 % (ref 0.0–0.2)

## 2023-05-07 LAB — I-STAT CHEM 8, ED
BUN: 16 mg/dL (ref 6–20)
Calcium, Ion: 1.24 mmol/L (ref 1.15–1.40)
Chloride: 102 mmol/L (ref 98–111)
Creatinine, Ser: 1.1 mg/dL (ref 0.61–1.24)
Glucose, Bld: 73 mg/dL (ref 70–99)
HCT: 52 % (ref 39.0–52.0)
Hemoglobin: 17.7 g/dL — ABNORMAL HIGH (ref 13.0–17.0)
Potassium: 3.7 mmol/L (ref 3.5–5.1)
Sodium: 139 mmol/L (ref 135–145)
TCO2: 29 mmol/L (ref 22–32)

## 2023-05-07 MED ORDER — TRAMADOL HCL 50 MG PO TABS
50.0000 mg | ORAL_TABLET | Freq: Once | ORAL | Status: AC
  Administered 2023-05-07: 50 mg via ORAL
  Filled 2023-05-07: qty 1

## 2023-05-07 MED ORDER — IOHEXOL 350 MG/ML SOLN
100.0000 mL | Freq: Once | INTRAVENOUS | Status: AC | PRN
  Administered 2023-05-07: 100 mL via INTRAVENOUS

## 2023-05-07 MED ORDER — SODIUM CHLORIDE (PF) 0.9 % IJ SOLN
INTRAMUSCULAR | Status: AC
  Filled 2023-05-07: qty 50

## 2023-05-07 MED ORDER — IOHEXOL 350 MG/ML SOLN
60.0000 mL | Freq: Once | INTRAVENOUS | Status: AC | PRN
  Administered 2023-05-07: 60 mL via INTRAVENOUS

## 2023-05-07 MED ORDER — TRAMADOL HCL 50 MG PO TABS: 50.0000 mg | ORAL_TABLET | Freq: Four times a day (QID) | ORAL | 0 refills | Status: AC | PRN

## 2023-05-07 MED ORDER — TETANUS-DIPHTH-ACELL PERTUSSIS 5-2.5-18.5 LF-MCG/0.5 IM SUSY
0.5000 mL | PREFILLED_SYRINGE | Freq: Once | INTRAMUSCULAR | Status: AC
  Administered 2023-05-07: 0.5 mL via INTRAMUSCULAR
  Filled 2023-05-07: qty 0.5

## 2023-05-07 NOTE — ED Notes (Signed)
US PIV placed. 

## 2023-05-07 NOTE — ED Provider Notes (Signed)
Pt signed out to check CTA, if neg, plan for d/c in [employer] custody.  CTA neg for acute process. NS reconsulted - they reviewed imaging, they indicate stable fx, keep in collar for 6 weeks.   For fibula fx, splinted/crutches, ortho f/u.  Recheck, no new c/o, vitals normal, no distress. Pt currently appears stable for d/c.   Return precautions provided.      Cathren Laine, MD 05/07/23 1024
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
